# Patient Record
Sex: Male | Born: 1995 | Race: Black or African American | Hispanic: No | Marital: Single | Smoking: Never smoker
Health system: Southern US, Community
[De-identification: ages and names within clinical notes are randomized; demographics above are authoritative.]

---
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
## 2018-06-11 ENCOUNTER — Emergency Department (HOSPITAL_COMMUNITY): Payer: BLUE CROSS/BLUE SHIELD

## 2018-06-11 ENCOUNTER — Other Ambulatory Visit: Payer: Self-pay

## 2018-06-11 ENCOUNTER — Encounter (HOSPITAL_COMMUNITY): Payer: Self-pay | Admitting: Emergency Medicine

## 2018-06-11 ENCOUNTER — Emergency Department (HOSPITAL_COMMUNITY)
Admission: EM | Admit: 2018-06-11 | Discharge: 2018-06-11 | Disposition: A | Discharge status: Home / Self Care | Payer: BLUE CROSS/BLUE SHIELD | Attending: Emergency Medicine | Admitting: Emergency Medicine

## 2018-06-11 DIAGNOSIS — Y998 Other external cause status: Secondary | ICD-10-CM | POA: Diagnosis not present

## 2018-06-11 DIAGNOSIS — X509XXA Other and unspecified overexertion or strenuous movements or postures, initial encounter: Secondary | ICD-10-CM | POA: Insufficient documentation

## 2018-06-11 DIAGNOSIS — Y9289 Other specified places as the place of occurrence of the external cause: Secondary | ICD-10-CM | POA: Insufficient documentation

## 2018-06-11 DIAGNOSIS — S63259A Unspecified dislocation of unspecified finger, initial encounter: Secondary | ICD-10-CM | POA: Insufficient documentation

## 2018-06-11 DIAGNOSIS — Y9389 Activity, other specified: Secondary | ICD-10-CM | POA: Diagnosis not present

## 2018-06-11 DIAGNOSIS — S6992XA Unspecified injury of left wrist, hand and finger(s), initial encounter: Secondary | ICD-10-CM | POA: Diagnosis present

## 2018-06-11 MED ORDER — LIDOCAINE HCL 2 % IJ SOLN
10.0000 mL | Freq: Once | INTRAMUSCULAR | Status: AC
  Administered 2018-06-11: 10 mL via INTRADERMAL
  Filled 2018-06-11: qty 20

## 2018-06-11 NOTE — ED Notes (Signed)
ED Provider at bedside. 

## 2018-06-11 NOTE — ED Triage Notes (Signed)
Pt reports doing a backflip and landed with left hand on concrete and has deformity to left middle finger. Pt reports incident occurred at 2300 on 06/10/18

## 2018-06-11 NOTE — ED Provider Notes (Signed)
[facility]-EMERGENCY DEPT Provider Note  CSN: [id number] Arrival date & time: 06/11/18 0018  Chief Complaint(s) Finger Injury  HPI Jeffrey Pineda is a 22 y.o. male healthy male who presents to the emergency department with left total finger pain and deformity.  Reports that he was doing back flip on concrete and landed on his left hand.  This occurred approximately 3 hours prior to arrival.  Pain exacerbated with palpation and movement.  No alleviating factors.  He denies any other injuries or physical complaints.  Denies any headache, neck pain, back pain, other extremity pain.  The history is provided by the patient.    Past Medical History History reviewed. No pertinent past medical history. There are no active problems to display for this patient.  Home Medication(s) Prior to Admission medications   Not on File                                                                                                                                    Past Surgical History Past Surgical History:  Procedure Laterality Date  . SCAPHOID FRACTURE SURGERY Left 2015   Family History History reviewed. No pertinent family history.  Social History Social History   Tobacco Use  . Smoking status: Never Smoker  . Smokeless tobacco: Never Used  Substance Use Topics  . Alcohol use: Yes  . Drug use: Never   Allergies Patient has no known allergies.  Review of Systems Review of Systems All other systems are reviewed and are negative for acute change except as noted in the HPI  Physical Exam Vital Signs  I have reviewed the triage vital signs BP (!) 170/84 (BP Location: Right Arm)   Pulse 79   Temp 98.7 F (37.1 C) (Oral)   Resp 16   Ht 6\' 2"  (1.88 m)   Wt 97.5 kg   SpO2 98%   BMI 27.60 kg/m   Physical Exam  Constitutional: He is oriented to person, place, and time. He appears well-developed and well-nourished. No distress.  HENT:  Head: Normocephalic and  atraumatic.  Right Ear: External ear normal.  Left Ear: External ear normal.  Nose: Nose normal.  Mouth/Throat: Mucous membranes are normal. No trismus in the jaw.  Eyes: Conjunctivae and EOM are normal. No scleral icterus.  Neck: Normal range of motion and phonation normal.  Cardiovascular: Normal rate and regular rhythm.  Pulmonary/Chest: Effort normal. No stridor. No respiratory distress.  Abdominal: He exhibits no distension.  Musculoskeletal: Normal range of motion. He exhibits no edema.  Neurological: He is alert and oriented to person, place, and time.  Skin: He is not diaphoretic.  Psychiatric: He has a normal mood and affect. His behavior is normal.  Vitals reviewed.   ED Results and Treatments Labs (all labs ordered are listed, but only abnormal results are displayed) Labs Reviewed - No data to display  EKG  EKG Interpretation  Date/Time:    Ventricular Rate:    PR Interval:    QRS Duration:   QT Interval:    QTC Calculation:   R Axis:     Text Interpretation:        Radiology Dg Finger Middle Left  Result Date: 06/11/2018 CLINICAL DATA:  Status post reduction of left middle finger dislocation. Initial encounter. EXAM: LEFT MIDDLE FINGER 2+V COMPARISON:  Left middle finger radiographs performed earlier today at 12:57 a.m. FINDINGS: There has been successful reduction of the dislocation at the third proximal interphalangeal joint. No definite fracture is seen. Mild surrounding soft tissue swelling is noted. No radiopaque foreign bodies are identified. IMPRESSION: Successful reduction of dislocation at the third proximal interphalangeal joint. No definite fracture seen. Electronically Signed   By: Roanna RaiderJeffery  Chang M.D.   On: 06/11/2018 02:50   Dg Finger Middle Left  Result Date: 06/11/2018 CLINICAL DATA:  Finger injury EXAM: LEFT MIDDLE FINGER 2+V  COMPARISON:  None. FINDINGS: Dorsal dislocation of the left third middle phalanx relative to the head of the third proximal phalanx. No acute fracture. No other dislocation. IMPRESSION: Dorsal dislocation at the left third PIP joint. Electronically Signed   By: Elige KoHetal  Patel   On: 06/11/2018 01:04   Pertinent labs & imaging results that were available during my care of the patient were reviewed by me and considered in my medical decision making (see chart for details).  Medications Ordered in ED Medications  lidocaine (XYLOCAINE) 2 % (with pres) injection 200 mg (10 mLs Intradermal Given 06/11/18 0213)                                                                                                                                    Procedures .Ortho Injury Treatment Date/Time: 06/11/2018 9:19 AM Performed by: Nira Connardama, Montrail Mehrer Eduardo, MD Authorized by: Nira Connardama, Britania Shreeve Eduardo, MD   Consent:    Consent obtained:  Verbal   Consent given by:  Patient   Risks discussed:  Fracture, irreducible dislocation and recurrent dislocation   Alternatives discussed:  Alternative treatment and immobilizationInjury location: finger Location details: left long finger Injury type: dislocation Dislocation type: PIP Pre-procedure neurovascular assessment: neurovascularly intact Anesthesia: digital block  Anesthesia: Local anesthesia used: yes Local Anesthetic: lidocaine 1% without epinephrine Anesthetic total: 4 mL  Patient sedated: NoManipulation performed: yes Reduction successful: yes X-ray confirmed reduction: yes Immobilization: splint Splint type: static finger Supplies used: aluminum splint Post-procedure neurovascular assessment: post-procedure neurovascularly intact     (including critical care time)  Medical Decision Making / ED Course I have reviewed the nursing notes for this encounter and the patient's prior records (if available in EHR or on provided paperwork).    Finger  dislocation reduced as above successfully and splinted.   The patient is safe for discharge with strict return precautions.   Final Clinical Impression(s) / ED Diagnoses Final diagnoses:  Dislocation of finger,  initial encounter    Disposition: Discharge  Condition: Good  I have discussed the results, Dx and Tx plan with the patient who expressed understanding and agree(s) with the plan. Discharge instructions discussed at great length. The patient was given strict return precautions who verbalized understanding of the instructions. No further questions at time of discharge.    ED Discharge Orders    None        This chart was dictated using voice recognition software.  Despite best efforts to proofread,  errors can occur which can change the documentation meaning.   Nira Conn, MD 06/11/18 [phone]

## 2018-06-11 NOTE — ED Notes (Signed)
Bed: WTR6 Expected date:  Expected time:  Means of arrival:  Comments: 

## 2020-10-07 IMAGING — CR DG FINGER MIDDLE 2+V*L*
3 series · 3 of 3 positions shown · non-contrast
Comparison: Left middle finger radiographs performed earlier today
at [DATE] a.m.

CLINICAL DATA: Status post reduction of left middle finger
dislocation. Initial encounter.

EXAM:
LEFT MIDDLE FINGER 2+V

[x finger pa left]
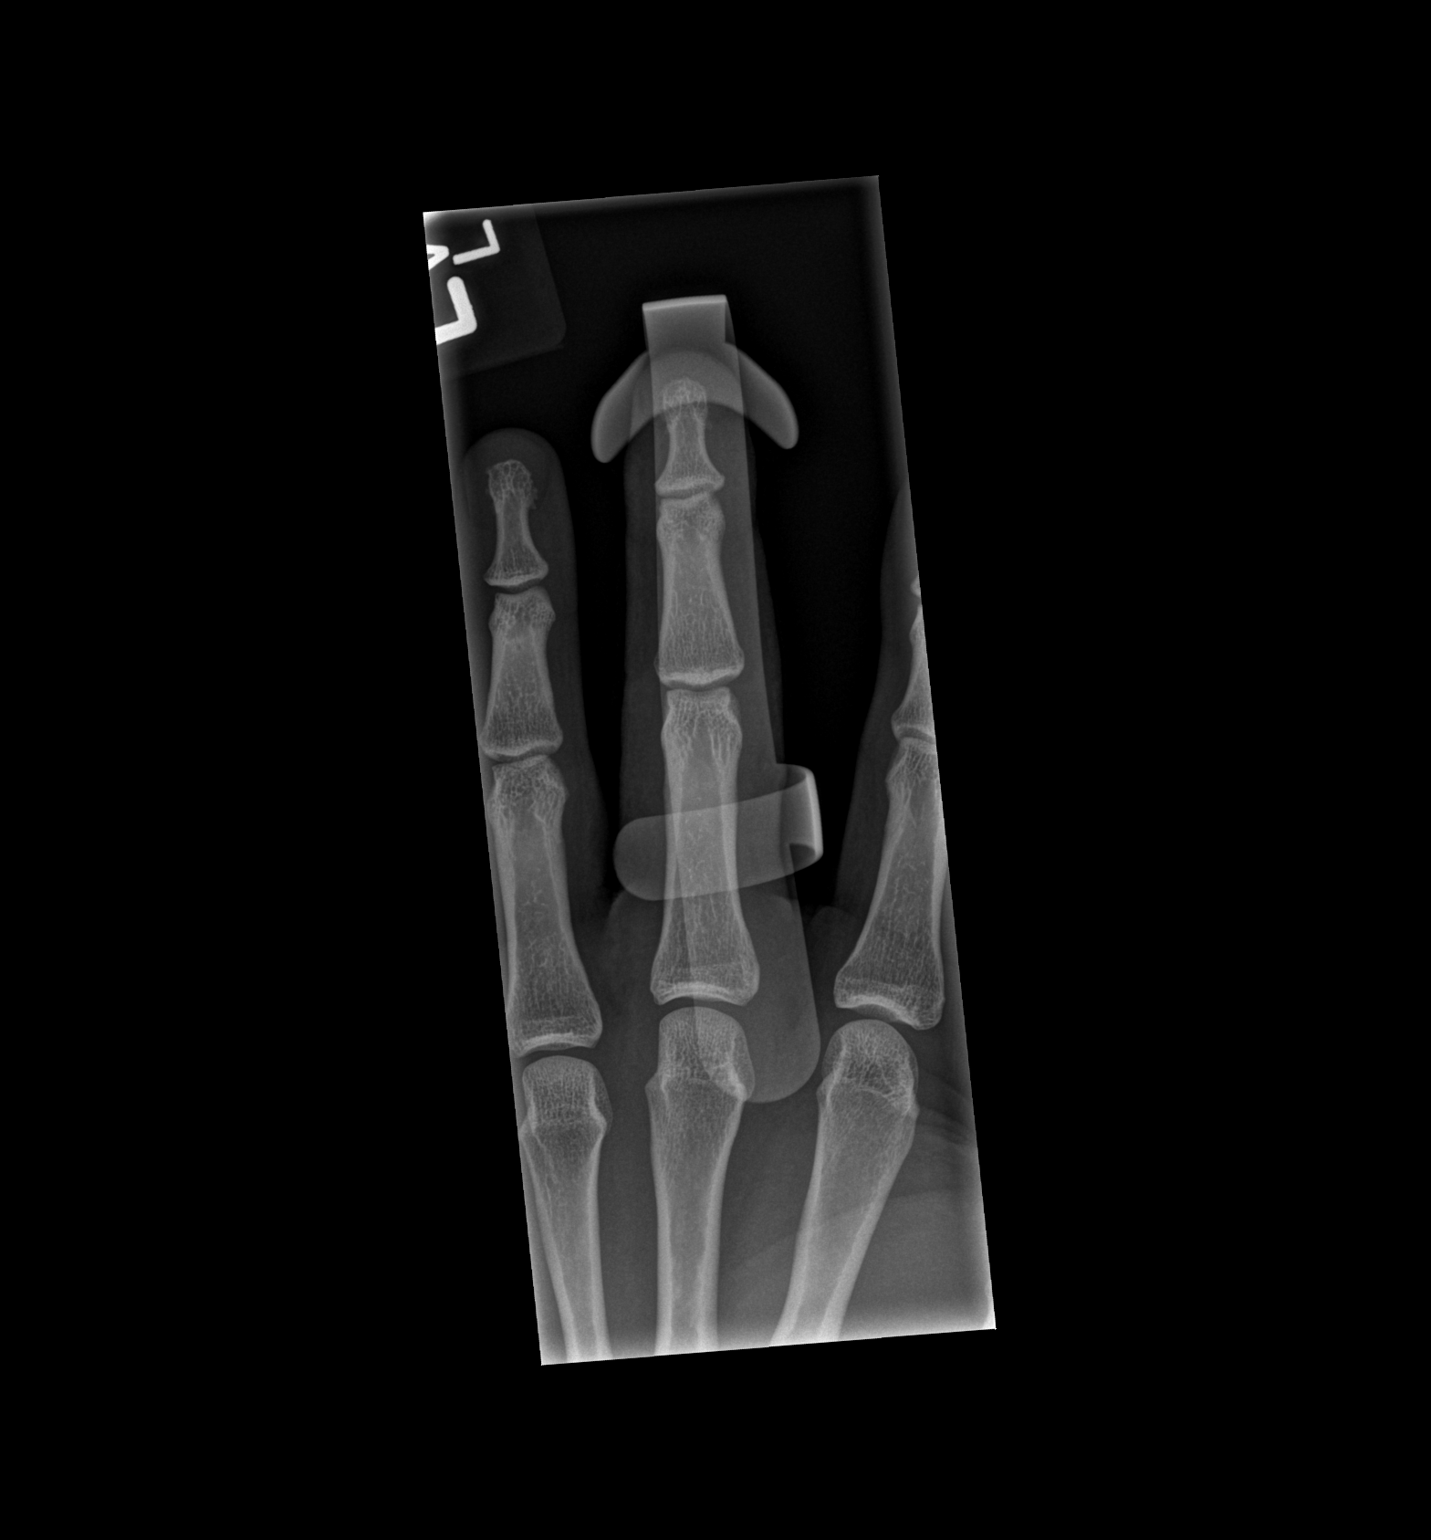

[x finger obl left]
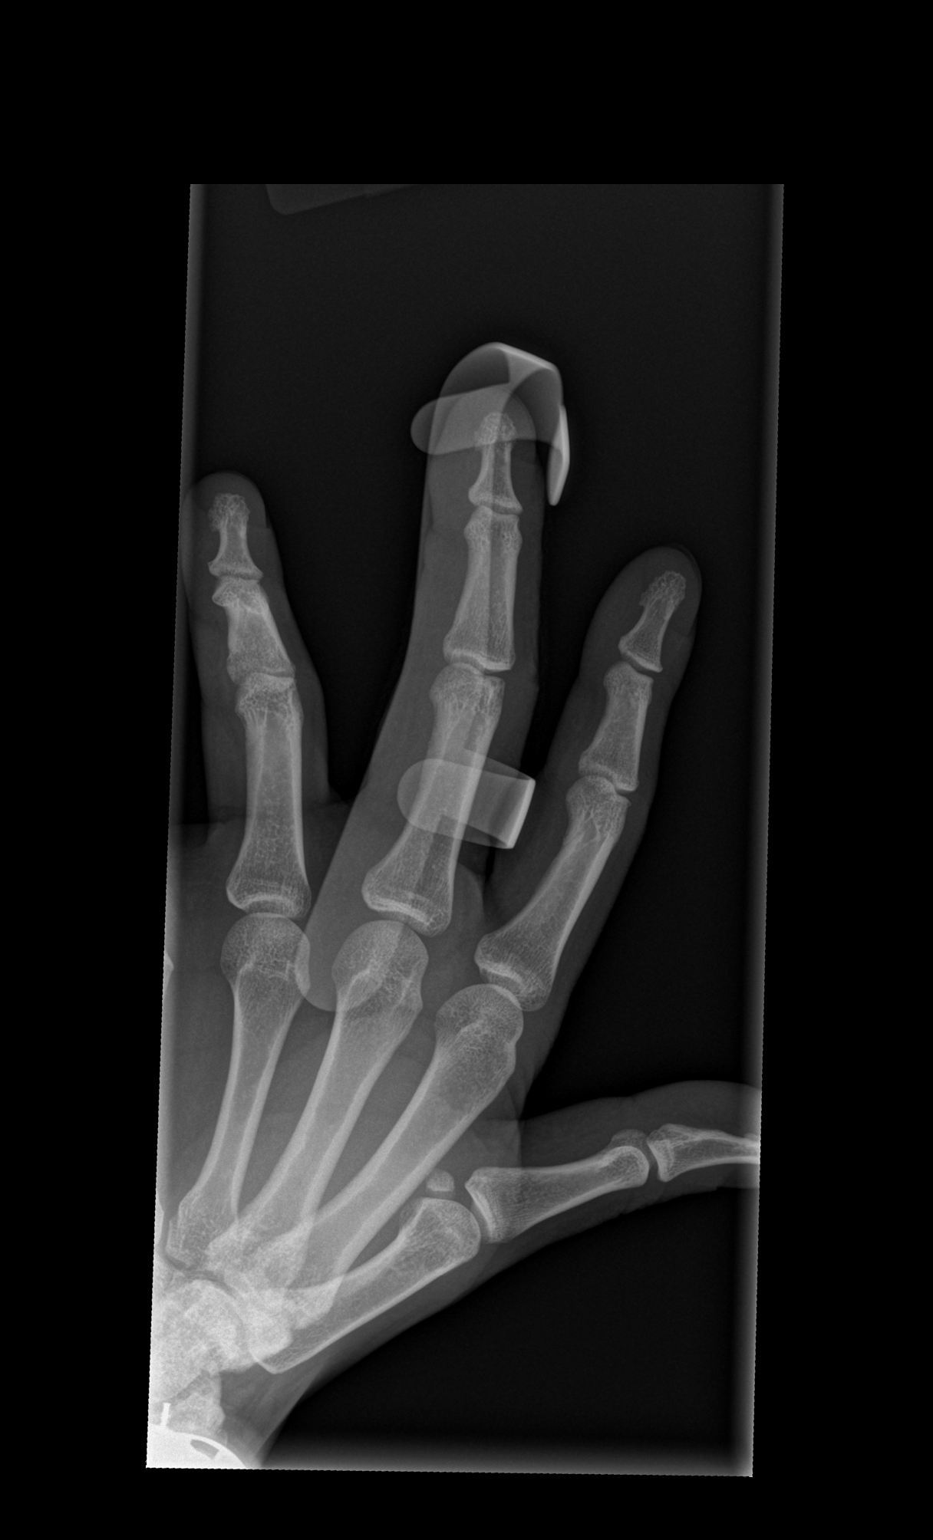

[x finger lat left]
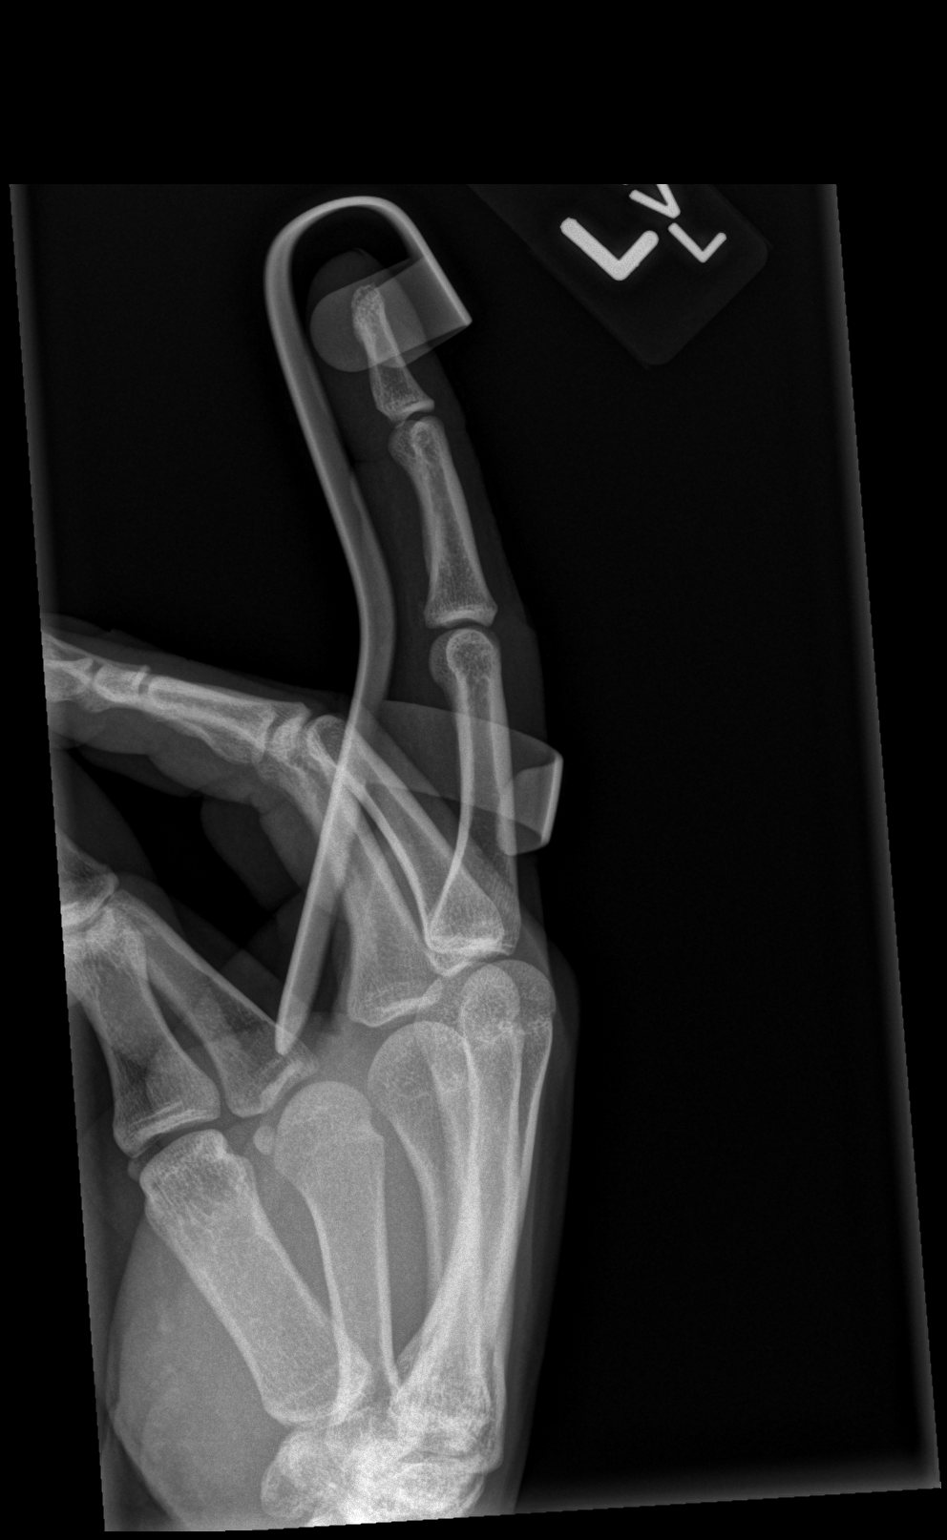

[3 of 3 positions shown; findings below may reference images not displayed]

FINDINGS: There has been successful reduction of the dislocation at the third
proximal interphalangeal joint. No definite fracture is seen. Mild
surrounding soft tissue swelling is noted. No radiopaque foreign
bodies are identified.
IMPRESSION: Successful reduction of dislocation at the third proximal
interphalangeal joint. No definite fracture seen.
